# Patient Record
Sex: Male | Born: 1995 | Hispanic: No | Marital: Single | Smoking: Never smoker
Health system: Southern US, Community
[De-identification: ages and names within clinical notes are randomized; demographics above are authoritative.]

## PROBLEM LIST (undated history)

## (undated) DIAGNOSIS — F79 Unspecified intellectual disabilities: Secondary | ICD-10-CM

---
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
## 2015-01-29 ENCOUNTER — Emergency Department (HOSPITAL_COMMUNITY): Payer: Medicaid Other

## 2015-01-29 ENCOUNTER — Encounter (HOSPITAL_COMMUNITY): Payer: Self-pay | Admitting: *Deleted

## 2015-01-29 ENCOUNTER — Emergency Department (HOSPITAL_COMMUNITY)
Admission: EM | Admit: 2015-01-29 | Discharge: 2015-01-30 | Disposition: A | Discharge status: Home / Self Care | Payer: Medicaid Other | Attending: Emergency Medicine | Admitting: Emergency Medicine

## 2015-01-29 ENCOUNTER — Encounter (HOSPITAL_COMMUNITY): Payer: Self-pay | Admitting: Emergency Medicine

## 2015-01-29 ENCOUNTER — Emergency Department (INDEPENDENT_AMBULATORY_CARE_PROVIDER_SITE_OTHER)
Admission: EM | Admit: 2015-01-29 | Discharge: 2015-01-29 | Disposition: A | Discharge status: Nursing Home (Includes ICF) | Payer: Self-pay | Source: Home / Self Care | Attending: Emergency Medicine | Admitting: Emergency Medicine

## 2015-01-29 DIAGNOSIS — R441 Visual hallucinations: Secondary | ICD-10-CM

## 2015-01-29 DIAGNOSIS — R440 Auditory hallucinations: Secondary | ICD-10-CM

## 2015-01-29 DIAGNOSIS — R443 Hallucinations, unspecified: Secondary | ICD-10-CM | POA: Insufficient documentation

## 2015-01-29 DIAGNOSIS — Z8659 Personal history of other mental and behavioral disorders: Secondary | ICD-10-CM | POA: Insufficient documentation

## 2015-01-29 HISTORY — DX: Unspecified intellectual disabilities: F79

## 2015-01-29 LAB — CBC WITH DIFFERENTIAL/PLATELET
Basophils Absolute: 0 10*3/uL (ref 0.0–0.1)
Basophils Relative: 1 % (ref 0–1)
EOS ABS: 0.1 10*3/uL (ref 0.0–0.7)
EOS PCT: 1 % (ref 0–5)
HEMATOCRIT: 45 % (ref 39.0–52.0)
HEMOGLOBIN: 14.8 g/dL (ref 13.0–17.0)
LYMPHS ABS: 1 10*3/uL (ref 0.7–4.0)
Lymphocytes Relative: 17 % (ref 12–46)
MCH: 29.8 pg (ref 26.0–34.0)
MCHC: 32.9 g/dL (ref 30.0–36.0)
MCV: 90.7 fL (ref 78.0–100.0)
MONO ABS: 0.4 10*3/uL (ref 0.1–1.0)
MONOS PCT: 8 % (ref 3–12)
Neutro Abs: 4.1 10*3/uL (ref 1.7–7.7)
Neutrophils Relative %: 73 % (ref 43–77)
PLATELETS: 237 10*3/uL (ref 150–400)
RBC: 4.96 MIL/uL (ref 4.22–5.81)
RDW: 13.2 % (ref 11.5–15.5)
WBC: 5.6 10*3/uL (ref 4.0–10.5)

## 2015-01-29 LAB — COMPREHENSIVE METABOLIC PANEL
ALBUMIN: 4.7 g/dL (ref 3.5–5.2)
ALT: 22 U/L (ref 0–53)
AST: 32 U/L (ref 0–37)
Alkaline Phosphatase: 48 U/L (ref 39–117)
Anion gap: 9 (ref 5–15)
BUN: 10 mg/dL (ref 6–23)
CALCIUM: 9.2 mg/dL (ref 8.4–10.5)
CO2: 28 mmol/L (ref 19–32)
CREATININE: 1.04 mg/dL (ref 0.50–1.35)
Chloride: 104 mmol/L (ref 96–112)
GFR calc Af Amer: >90 mL/min (ref >90)
GFR calc non Af Amer: >90 mL/min (ref >90)
Glucose, Bld: 70 mg/dL (ref 70–99)
Potassium: 3.6 mmol/L (ref 3.5–5.1)
SODIUM: 141 mmol/L (ref 135–145)
TOTAL PROTEIN: 7.3 g/dL (ref 6.0–8.3)
Total Bilirubin: 0.9 mg/dL (ref 0.3–1.2)

## 2015-01-29 LAB — RAPID HIV SCREEN (HIV 1/2 AB+AG)
HIV 1/2 ANTIBODIES: NONREACTIVE
HIV-1 P24 Antigen - HIV24: NONREACTIVE

## 2015-01-29 LAB — SALICYLATE LEVEL

## 2015-01-29 LAB — ACETAMINOPHEN LEVEL: Acetaminophen (Tylenol), Serum: <10 ug/mL — ABNORMAL LOW (ref 10–30)

## 2015-01-29 LAB — I-STAT CG4 LACTIC ACID, ED: LACTIC ACID, VENOUS: 1.27 mmol/L (ref 0.5–2.0)

## 2015-01-29 LAB — ETHANOL: Alcohol, Ethyl (B): <5 mg/dL (ref 0–9)

## 2015-01-29 MED ORDER — SODIUM CHLORIDE 0.9 % IV BOLUS (SEPSIS)
1000.0000 mL | Freq: Once | INTRAVENOUS | Status: AC
  Administered 2015-01-29: 1000 mL via INTRAVENOUS

## 2015-01-29 NOTE — ED Notes (Signed)
MD at bedside. 

## 2015-01-29 NOTE — BH Assessment (Signed)
Tele Assessment Note   Andrew Travis is an 19 y.o. male.  -Clinician talked with Dr. Silverio Lay about need for TTS.  Patient came in with complaint of back pain.  He also said that he is hearing voices and seeing people that are not there.  Head CT did not indicate anything unusual.  Patient speaks Spanish mainly.  He is very quiet and must be encouraged to speak up.  Patient does not elaborate on questions asked and will look back to parents about some answers.  Patient lives with parents and younger brother, who are also in the room.  Patient said that he has been seeing grey people and voices are telling him to do bad things.  Patient says he does not want to do the bad things however.  Pt said to Dr. Silverio Lay that voices tell him to kill himself.  Patient also says that these visual hallucinations are touching him.    Pt denies any HI or SI.  No previous suicide attempts.  Patient has no previous inpatient or outpatient care.  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  He says he graduated from high school.  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    When asked if he would consider staying at the mental health hospital for a few days he says no.  Parents are asked this same question and they said they would like to have him back home.    -Clinician discussed patient care with Hulan Fess, NP who said that she would like to see what a UDS would show and a thyroid panel.  Otherwise if parents can provide a safe environment and follow up on outpatient referrals, he should be able to go home.  Dr. Silverio Lay said that the thyroid panel would take a day to come back and he also doubts there would be anything to show up on a UDS.  He asked about having psychiatry see him in the AM on 03/19.  We talked about whether this would be advantageous if parents and patient were not open to inpatient care.  Dr. Silverio Lay is going to see patient and family again.  Axis I: Psychotic Disorder  NOS Axis II: Deferred Axis III:  Past Medical History  Diagnosis Date  . Mental retardation    Axis IV: other psychosocial or environmental problems Axis V: 41-50 serious symptoms  Past Medical History:  Past Medical History  Diagnosis Date  . Mental retardation     History reviewed. No pertinent past surgical history.  Family History: No family history on file.  Social History:  reports that he has never smoked. He does not have any smokeless tobacco history on file. He reports that he does not drink alcohol or use illicit drugs.  Additional Social History:  Alcohol / Drug Use Pain Medications: None Prescriptions: NOne Over the Counter: N/A History of alcohol / drug use?: No history of alcohol / drug abuse (Pt denies)  CIWA: CIWA-Ar BP: 101/62 mmHg Pulse Rate: (!) 40 COWS:    PATIENT STRENGTHS: (choose at least two) Supportive family/friends Work skills  Allergies: No Known Allergies  Home Medications:  (Not in a hospital admission)  OB/GYN Status:  No LMP for male patient.  General Assessment Data Location of Assessment: [facility] ED Is this a Tele or Face-to-Face Assessment?: Tele Assessment Is this an Initial Assessment or a Re-assessment for this encounter?: Initial Assessment Living Arrangements: Parent (Lives with parents and brother) Can pt return to current living arrangement?:  Yes Admission Status: Voluntary Is patient capable of signing voluntary admission?: Yes Transfer from: Acute Hospital Referral Source: Self/Family/Friend     [facility] Crisis Care Plan Living Arrangements: Parent (Lives with parents and brother) Name of Psychiatrist: None Name of Therapist: None  Education Status Highest grade of school patient has completed: HS graduate  Risk to self with the past 6 months Suicidal Ideation: No Suicidal Intent: No Is patient at risk for suicide?: No Suicidal Plan?: No Access to Means: No What has been your use of drugs/alcohol within the last  12 months?: Pt denies Previous Attempts/Gestures: No How many times?: 0 Other Self Harm Risks: None Triggers for Past Attempts: None known Intentional Self Injurious Behavior: None Family Suicide History: No Recent stressful life event(s): Other (Comment) (Pt cannot identify any.) Persecutory voices/beliefs?: Yes Depression: Yes Depression Symptoms: Despondent, Insomnia, Loss of interest in usual pleasures, Feeling worthless/self pity Substance abuse history and/or treatment for substance abuse?: No Suicide prevention information given to non-admitted patients: Not applicable  Risk to Others within the past 6 months Homicidal Ideation: No Thoughts of Harm to Others: No Current Homicidal Intent: No Current Homicidal Plan: No Access to Homicidal Means: No Identified Victim: No one History of harm to others?: No Assessment of Violence: None Noted Violent Behavior Description: None reported Does patient have access to weapons?: No Criminal Charges Pending?: No Does patient have a court date: No  Psychosis Hallucinations: Auditory, Visual, Tactile Wallace Cullens people who tell him bad things and touch him.) Delusions: None noted  Mental Status Report Appearance/Hygiene: Disheveled, In scrubs Eye Contact: Good Motor Activity: Freedom of movement, Unremarkable Speech: Logical/coherent Level of Consciousness: Alert Mood: Depressed, Helpless, Despair, Sad Affect: Depressed, Blunted Anxiety Level: None Thought Processes: Coherent, Relevant Judgement: Unimpaired Orientation: Person, Place, Time, Situation Obsessive Compulsive Thoughts/Behaviors: None  Cognitive Functioning Concentration: Decreased Memory: Recent Impaired, Remote Impaired IQ: Average Insight: Poor Impulse Control: Fair Appetite: Good Weight Loss: 0 Weight Gain: 0 Sleep: Decreased Total Hours of Sleep:  (<4H last night) Vegetative Symptoms: None  ADLScreening [facility] Assessment Services) Patient's cognitive  ability adequate to safely complete daily activities?: Yes Patient able to express need for assistance with ADLs?: Yes Independently performs ADLs?: Yes (appropriate for developmental age)  Prior Inpatient Therapy Prior Inpatient Therapy: No Prior Therapy Dates: None Prior Therapy Facilty/Provider(s): None Reason for Treatment: None  Prior Outpatient Therapy Prior Outpatient Therapy: No Prior Therapy Dates: NOne Prior Therapy Facilty/Provider(s): None Reason for Treatment: NOne  ADL Screening (condition at time of admission) Patient's cognitive ability adequate to safely complete daily activities?: Yes Is the patient deaf or have difficulty hearing?: No Does the patient have difficulty seeing, even when wearing glasses/contacts?: No (Pt has glasses.) Does the patient have difficulty concentrating, remembering, or making decisions?: Yes Patient able to express need for assistance with ADLs?: Yes Does the patient have difficulty dressing or bathing?: No Independently performs ADLs?: Yes (appropriate for developmental age) Does the patient have difficulty walking or climbing stairs?: No Weakness of Legs: None Weakness of Arms/Hands: None       Abuse/Neglect Assessment (Assessment to be complete while patient is alone) Physical Abuse: Denies Verbal Abuse: Denies Sexual Abuse: Denies Exploitation of patient/patient's resources: Denies Self-Neglect: Denies     Merchant navy officer (For Healthcare) Does patient have an advance directive?: No Would patient like information on creating an advanced directive?: No - patient declined information    Additional Information 1:1 In Past 12 Months?: No CIRT Risk: No Elopement Risk: No Does patient have medical  clearance?: Yes     Disposition:  Disposition Initial Assessment Completed for this Encounter: Yes Disposition of Patient: Other dispositions Other disposition(s): Other (Comment) (To be reviewed by NP)  Beatriz StallionHarvey, Alban Marucci  Ray 01/29/2015 11:18 PM

## 2015-01-29 NOTE — ED Provider Notes (Signed)
CSN: [id number]     Arrival date & time 01/29/15  1425 History   First MD Initiated Contact with Patient 01/29/15 1631     Chief Complaint  Patient presents with  . Fever   (Consider location/radiation/quality/duration/timing/severity/associated sxs/prior Treatment) HPI         19 year old male with a history of some unspecified cognitive deficits is brought in by his mom for evaluation of having fever and shaking chills last night as well as hallucinations. He says that last night he saw some very skinny people and he heard them saying his name, and they were telling him to do bad things but he would not do them because there was another voice telling him not to do those things. He also complains of some pain in the back of his neck, it is not that bad. He has never been seen for this problem before and has no documented history of hallucinations.  Past Medical History  Diagnosis Date  . Mental retardation    History reviewed. No pertinent past surgical history. No family history on file. History  Substance Use Topics  . Smoking status: Never Smoker   . Smokeless tobacco: Not on file  . Alcohol Use: No    Review of Systems  Constitutional: Positive for chills.  Musculoskeletal: Positive for neck pain.  Psychiatric/Behavioral: Positive for hallucinations.  All other systems reviewed and are negative.   Allergies  Review of patient's allergies indicates no known allergies.  Home Medications   Prior to Admission medications   Not on File   BP 153/81 mmHg  Pulse 71  Temp(Src) 99.5 F (37.5 C) (Oral)  Resp 14  SpO2 97% Physical Exam  Constitutional: He is oriented to person, place, and time. He appears well-developed and well-nourished. No distress.  HENT:  Head: Normocephalic.  Pulmonary/Chest: Effort normal. No respiratory distress.  Neurological: He is alert and oriented to person, place, and time. Coordination normal.  Skin: Skin is warm and dry. No rash noted. He is  not diaphoretic.  Psychiatric: He has a normal mood and affect. His speech is normal. Judgment normal. His mood appears not anxious. His affect is not angry. He is slowed. He is not aggressive and not hyperactive. Thought content is delusional. Cognition and memory are impaired. He does not exhibit a depressed mood.  Nursing note and vitals reviewed.   ED Course  Procedures (including critical care time) Labs Review Labs Reviewed - No data to display  Imaging Review No results found.   MDM   1. Auditory hallucinations   2. Visual hallucination    19 year old male with new onset of command audiovisual hallucinations. Transferred to the emergency department for evaluation   Graylon GoodZachary H Dayvin Aber, PA-C 01/29/15 1657

## 2015-01-29 NOTE — ED Notes (Signed)
Mom brings pt in for fevers onset yest night Also reports "hallucinating" talking to himself Also reports HA Alert, no signs of acute distress.

## 2015-01-29 NOTE — ED Provider Notes (Addendum)
CSN: [id number]     Arrival date & time 01/29/15  1731 History   First MD Initiated Contact with Patient 01/29/15 2033     Chief Complaint  Patient presents with  . Psychiatric Evaluation     (Consider location/radiation/quality/duration/timing/severity/associated sxs/prior Treatment) The history is provided by the patient.  Andrew Travis is a 19 y.o. 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 Patient states that he has been having visual and auditory hallucinations for the last year or so. States that he sees skinny people walking around. These people also talks to him and tells him that he is too skinny and that he should kill himself. He denies any plans to kill himself. No history of psychiatric disorders. Patient was so afraid of the auditory hallucination yesterday that he wasn't able to sleep and told his family. His family got concerned and brought him to the ER for evaluation.   Past Medical History  Diagnosis Date  . Mental retardation    History reviewed. No pertinent past surgical history. No family history on file. History  Substance Use Topics  . Smoking status: Never Smoker   . Smokeless tobacco: Not on file  . Alcohol Use: No    Review of Systems  Psychiatric/Behavioral: Positive for hallucinations.  All other systems reviewed and are negative.     Allergies  Review of patient's allergies indicates no known allergies.  Home Medications   Prior to Admission medications   Not on File   BP 101/62 mmHg  Pulse 40  Temp(Src) 97.9 F (36.6 C) (Oral)  Resp 18  SpO2 97% Physical Exam  Constitutional: He is oriented to person, place, and time. He appears well-developed.  HENT:  Head: Normocephalic.  Mouth/Throat: Oropharynx is clear and moist.  Eyes: Conjunctivae are normal. Pupils are equal, round, and reactive to light.  Neck: Normal range of motion.  Cardiovascular: Normal rate, regular rhythm and normal  heart sounds.   Pulmonary/Chest: Effort normal and breath sounds normal. No respiratory distress. He has no wheezes. He has no rales.  Abdominal: Soft. Bowel sounds are normal. He exhibits no distension. There is no tenderness. There is no rebound.  Musculoskeletal: Normal range of motion. He exhibits no edema.  Neurological: He is alert and oriented to person, place, and time. No cranial nerve deficit. Coordination normal.  Skin: Skin is warm and dry.  Psychiatric:  Poor judgement   Nursing note and vitals reviewed.   ED Course  Procedures (including critical care time) Labs Review Labs Reviewed  ACETAMINOPHEN LEVEL - Abnormal; Notable for the following:    Acetaminophen (Tylenol), Serum <10.0 (*)    All other components within normal limits  URINE CULTURE  CBC WITH DIFFERENTIAL/PLATELET  COMPREHENSIVE METABOLIC PANEL  RAPID HIV SCREEN (HIV 1/2 AB+AG)  ETHANOL  SALICYLATE LEVEL  URINALYSIS, ROUTINE W REFLEX MICROSCOPIC  URINE RAPID DRUG SCREEN (HOSP PERFORMED)  I-STAT CG4 LACTIC ACID, ED    Imaging Review Dg Chest 2 View  01/29/2015   CLINICAL DATA:  Fever today.  EXAM: CHEST  2 VIEW  COMPARISON:  None.  FINDINGS: Normal heart, mediastinum and hila. The lungs are clear. No pleural effusion or pneumothorax. Bony thorax is unremarkable.  IMPRESSION: Normal chest radiographs.   Electronically Signed   By: Amie Portland M.D.   On: 01/29/2015 21:32   Ct Head Wo Contrast  01/29/2015   CLINICAL DATA:  Hearing voices and seeing people not Visible to other people. His mom Reports no street  drug or alcohol use but he has had similar episodes in the past.  EXAM: CT HEAD WITHOUT CONTRAST  TECHNIQUE: Contiguous axial images were obtained from the base of the skull through the vertex without intravenous contrast.  COMPARISON:  None.  FINDINGS: Ventricles normal in size and configuration. There are no parenchymal masses or mass effect. There are no areas of abnormal parenchymal attenuation. No  evidence of an infarct.  There are no extra-axial masses or abnormal fluid collections.  There is no intracranial hemorrhage.  Visualized sinuses and mastoid air cells are clear. No skull lesion.  IMPRESSION: Normal unenhanced CT scan of the brain.   Electronically Signed   By: Amie Portlandavid  Ormond M.D.   On: 01/29/2015 21:42     EKG Interpretation None      MDM   Final diagnoses:  None    Andrew Travis is a 19 y.o. male here with new onset visual but mostly auditory hallucinations. Will get CT head to r/o mass. Will get HIV and psych clearance labs.   10:45 PM CT head unremarkable. Labs unremarkable. Medically cleared. TTS to see patient.    12:30 AM TTS saw patient. Recommend outpatient follow up as he denies suicidal ideation to them and has no plan. Offered psych admission but parents are concerned that he will be by himself since they can't stay with him. Offered overnight observation with psych consult in AM but they want to go home. Patient lives with parents. Will give list of resources.     Andrew Canalavid H Noble Cicalese, MD 01/29/15 2246  Andrew Canalavid H Rilee Wendling, MD 01/30/15 [phone]

## 2015-01-29 NOTE — ED Notes (Signed)
The pt [facility] with  Hearing voices and seeing people not  Visible to other people.  His mom  Reports no street drug or alcohol use but he has had similar episodes in the past.  Pt calm

## 2015-01-29 NOTE — ED Notes (Signed)
TTS in process 

## 2015-01-29 NOTE — ED Notes (Signed)
Patient not in room, pt transported to X-ray.

## 2015-01-30 LAB — URINALYSIS, ROUTINE W REFLEX MICROSCOPIC
Bilirubin Urine: NEGATIVE
GLUCOSE, UA: NEGATIVE mg/dL
Hgb urine dipstick: NEGATIVE
Ketones, ur: NEGATIVE mg/dL
Leukocytes, UA: NEGATIVE
NITRITE: NEGATIVE
PH: 6 (ref 5.0–8.0)
PROTEIN: NEGATIVE mg/dL
Specific Gravity, Urine: 1.011 (ref 1.005–1.030)
Urobilinogen, UA: 0.2 mg/dL (ref 0.0–1.0)

## 2015-01-30 LAB — RAPID URINE DRUG SCREEN, HOSP PERFORMED
AMPHETAMINES: NOT DETECTED
Barbiturates: NOT DETECTED
Benzodiazepines: NOT DETECTED
Cocaine: NOT DETECTED
Opiates: NOT DETECTED
TETRAHYDROCANNABINOL: NOT DETECTED

## 2015-01-30 LAB — TSH: TSH: 3.909 u[IU]/mL (ref 0.350–4.500)

## 2015-01-30 NOTE — Discharge Instructions (Signed)
See a psychiatrist.   Return to ER if you have thoughts of harming yourself or others, hallucinations.    Emergency Department Resource Guide 1) Find a Doctor and Pay Out of Pocket Although you won't have to find out who is covered by your insurance plan, it is a good idea to ask around and get recommendations. You will then need to call the office and see if the doctor you have chosen will accept you as a new patient and what types of options they offer for patients who are self-pay. Some doctors offer discounts or will set up payment plans for their patients who do not have insurance, but you will need to ask so you aren't surprised when you get to your appointment.  2) Contact Your Local Health Department Not all health departments have doctors that can see patients for sick visits, but many do, so it is worth a call to see if yours does. If you don't know where your local health department is, you can check in your phone book. The CDC also has a tool to help you locate your state's health department, and many state websites also have listings of all of their local health departments.  3) Find a Walk-in Clinic If your illness is not likely to be very severe or complicated, you may want to try a walk in clinic. These are popping up all over the country in pharmacies, drugstores, and shopping centers. They're usually staffed by nurse practitioners or physician assistants that have been trained to treat common illnesses and complaints. They're usually fairly quick and inexpensive. However, if you have serious medical issues or chronic medical problems, these are probably not your best option.  No Primary Care Doctor: - Call Health Connect at  [phone] - they can help you locate a primary care doctor that  accepts your insurance, provides certain services, etc. - Physician Referral Service- [phone]  Chronic Pain Problems: Organization         Address  Phone   Notes  [facility]  [phone] Patients need to be referred by their primary care doctor.   Medication Assistance: Organization         Address  Phone   Notes  [facility] [address] [place], [place] [address] [phone] --Must be a resident of [facility] [place] -- Must have NO insurance coverage whatsoever (no Medicaid/ Medicare, etc.) -- The pt. MUST have a primary care doctor that directs their care regularly and follows them in the community   MedAssist  [phone]) [phone]   [employer]  [phone]    Agencies that provide inexpensive medical care: Organization         Address  Phone   Notes  [facility]  [phone]   [facility]    [phone]   [facility] [address] [place], [place] [address] [phone]   [facility] [address], [place] [phone]) [phone]   [facility]anned Parenthood    [phone]   [facility]    [phone]   [facility]  [address], [place] Phone:  [phone], Fax:  [phone]) [phone] Hours of Operation:  9 am - 6 pm, M-F.  Also accepts Medicaid/Medicare and self-pay.  [facility]  [address], [place] Phone: [phone]) [phone], Fax: [phone].  Hours of Operation:  8:30 am - 5:30 pm, M-F.  Also accepts Medicaid and self-pay.  [facility] [place] [address], Home Phone: [phone]   [place], [place], [place] [phone], Ext. [phone] Mondays & Thursdays: 7-9 AM.  First 15 patients are seen on a first come, first serve basis.    [place] Providers:  Organization         Address  Phone   Notes  [facility] [address], [place] [phone] Also accepts self-pay patients.  [facility] [address], [employer]  [phone]   [place], [address], [place] [phone]   [facility] [address] [place] [phone]   Lucianne Lei [address], [place]   [phone] Only accepts [place] Access [place] patients after they have their name applied to their card.   Self-Pay (no insurance) in [facility]:  Organization         Address  Phone   Notes  Sickle Cell Patients, [facility] [place] [phone]   [facility] [place] [phone]   [facility] [place]  [place], [place],  [phone]   [facility]/Dr. Osei-Bonsu  [address] [place] or [address], [place] [phone] Phone number for both [place] and [place] locations is the same.  [facility] [address], [place] [phone]   [facility] [address] [place] or [address] [phone]   [facility] [address] [place] [phone], phone; [phone], fax Sees patients 1st and 3rd Saturday of every month.  Must not qualify for public or private insurance (i.e. Medicaid, Medicare, [place] Health Choice, Veterans' Benefits)  Household income should be no more than 200% of the poverty level The clinic cannot treat you if you are pregnant or think you are pregnant  Sexually transmitted diseases are not treated at the clinic.    Dental Care: Organization         Address  Phone  Notes  [facility] [phone] Accepts children up to age 74 who are enrolled in [place] or [place]; pregnant women with a Medicaid card; and children who have applied for Medicaid or [place]  Health Choice, but were declined, whose parents can pay a reduced fee at time of service.  [facility] of [facility]  [address], [place] [phone] Accepts children up to age 16 who are enrolled in [place] or [place]; pregnant women with a Medicaid card; and children who have applied for Medicaid or [place] Health Choice, but were declined, whose parents can pay a reduced fee at time of service.  [facility] Access PROGRAM  [place] [phone] Patients are seen by appointment only. Walk-ins are not accepted. [place] will see patients 18 years of age and older. Monday - Tuesday (8am-5pm) Most Wednesdays (8:30-5pm) $30 per visit, cash only  [facility] Adult [employer] PROGRAM  [address], [facility] [phone] Patients  are seen by appointment only. Walk-ins are not accepted. [place] will see patients 46 years of age and older. One Wednesday Evening (Monthly: Volunteer Based).  $30 per visit, cash only  [place]  [phone] for adults; Children under age 68, call Graduate Pediatric Dentistry at [phone]. Children aged 19-14, please call [phone] to request a pediatric application.  Dental services are provided in all areas of dental care including fillings, crowns and bridges, complete and partial dentures, implants, gum treatment, root canals, and extractions. Preventive care is also provided. Treatment is provided to both adults and children. Patients are selected via a lottery and there is often a waiting list.   [facility] [address], [place]  [phone] [email or web address]   [facility] [address] [place], [place] [phone], Ext. [phone] Second and Fourth Thursday of each month, opens at 6:30 AM; Clinic ends at 9 AM.  Patients are seen on a first-come first-served basis, and a limited number are seen during  each clinic.   [facility]  [address] Hillard Danker [place], [place] [phone]   Eligibility Requirements You must have lived in [place], [place], or [place] counties for at least the last three months.   You cannot be eligible for state or federal sponsored [employer], including [employer], [place], or [employer].   You generally cannot be eligible for healthcare insurance through your employer.    How to apply: Eligibility screenings are held every Tuesday and Wednesday afternoon from 1:00 pm until 4:00 pm. You do not need an appointment for the interview!  [facility] [address] [place], [place]   [place]  [place] Department  [place]  [phone]    Behavioral Health Resources in the Community: Intensive Outpatient Programs Organization         Address  Phone  Notes  [place] Hurricane. [address] [place], [place] [phone]   [facility]patient [address] [place], [place]   ADS: Alcohol & Drug Svcs [address] [place], [place]   [place] [address],  [place], [place] or [phone]   Substance Abuse Resources Organization         Address  Phone  Notes  Alcohol and Drug Services  [phone]   [place]  [phone]   [facility] [place]   Chinita Pester  [phone]   Residential & Outpatient Substance Abuse Program  [phone]   Psychological Services Organization         Address  Phone  Notes  [facility]  [place]  [phone]   [place] [address] [place] or [phone]    Mobile Crisis Teams Organization         Address  Phone  Notes  [facility]rapeutic Alternatives, Mobile  Crisis Care Unit  [phone]   [facility]otherapeutic Services  [address] [place], [place]   [place] Levels [address], [place] [phone]    Self-Help/Support Groups Organization         Address  Phone             Notes  [place]. of [place] - variety of support groups  [place] Call  for more information  Narcotics Anonymous (NA), [facility] [address], [place]  2 meetings at this location   Residential Facilities manager         Address  Phone  Notes  [facility] [place],    Lead  [phone]   [facility]  [address], [place] [phone], [place], [place]   [place], [place] [phone] Admissions: 8am-3pm M-F  Incentives Substance [place] [address],    [place], [place] [phone]   [facility] [address] [place], [place], [place]   [facility] [address]  [place], [place]   [facility] Programs - Intensive Outpatient [address]een [address], [place], [place]   [facility] [place].,  [place], [place] [phone] or [phone]   [facility] (RTS) [address] [place], [place] Accepts Medicaid  [facility] [address]  [place] [phone] Substance Abuse/Addiction Treatment   [facility] Organization         Address  Phone  Notes  [facility]  [phone]   Domenic Schwab, PhD [address] [place], [place]   [phone] or [phone]   [place], [place] [phone]   [facility] [address], [place], [place] [phone] Insurance/Medicaid/sponsorship through [facility] and Families [address]  [place]                                    [place], [place] [phone] [place] [address] [place] [address] [employer], [place] [phone]    Dr. Adele Schilder  [phone]   [facility] Dept. 1) [address] [place] 2) [place] 3)  [place] [address], [place] [phone]  [phone]   [place] [phone] or [phone] (After Hours)

## 2015-01-31 LAB — URINE CULTURE

## 2017-11-28 ENCOUNTER — Emergency Department (HOSPITAL_COMMUNITY)
Admission: EM | Admit: 2017-11-28 | Discharge: 2017-11-29 | Disposition: A | Discharge status: Home / Self Care | Payer: Self-pay | Attending: Emergency Medicine | Admitting: Emergency Medicine

## 2017-11-28 DIAGNOSIS — F22 Delusional disorders: Secondary | ICD-10-CM | POA: Insufficient documentation

## 2017-11-28 DIAGNOSIS — F341 Dysthymic disorder: Secondary | ICD-10-CM | POA: Insufficient documentation

## 2017-11-28 NOTE — ED Notes (Signed)
Bed: WA28 Expected date:  Expected time:  Means of arrival:  Comments: [facility] 

## 2017-11-28 NOTE — ED Triage Notes (Signed)
[facility]  D/t no physician on duty tonight pt has been acting very strangely. Pt while in [place] with pt was observed by family carrying multiple knifes around his waist . Father denies self injurious behavior aggression or AV hallucination  But pt did at one time in the recent past state he would harm himself if they didn't take him to [place]. Pt remains mute during this interview and father states this is his norm. Father [facility] in recent past was Rx Olanzapine 2.5 mg in AM and 5 mg at HS  And fluxetine 40 mg in the AM but believes pt is cheeking medications and spitting them out later.

## 2017-11-29 ENCOUNTER — Other Ambulatory Visit: Payer: Self-pay

## 2017-11-29 ENCOUNTER — Emergency Department (HOSPITAL_COMMUNITY): Payer: Self-pay

## 2017-11-29 LAB — COMPREHENSIVE METABOLIC PANEL
ALBUMIN: 4.2 g/dL (ref 3.5–5.0)
ALK PHOS: 64 U/L (ref 38–126)
ALT: 29 U/L (ref 17–63)
AST: 37 U/L (ref 15–41)
Anion gap: 8 (ref 5–15)
BILIRUBIN TOTAL: 0.2 mg/dL — AB (ref 0.3–1.2)
BUN: 16 mg/dL (ref 6–20)
CALCIUM: 9.1 mg/dL (ref 8.9–10.3)
CO2: 25 mmol/L (ref 22–32)
Chloride: 104 mmol/L (ref 101–111)
Creatinine, Ser: 1.05 mg/dL (ref 0.61–1.24)
GFR calc Af Amer: >60 mL/min (ref >60)
GLUCOSE: 118 mg/dL — AB (ref 65–99)
POTASSIUM: 3.7 mmol/L (ref 3.5–5.1)
Sodium: 137 mmol/L (ref 135–145)
TOTAL PROTEIN: 6.9 g/dL (ref 6.5–8.1)

## 2017-11-29 LAB — SALICYLATE LEVEL: Salicylate Lvl: <7 mg/dL (ref 2.8–30.0)

## 2017-11-29 LAB — CBC
HCT: 43.3 % (ref 39.0–52.0)
Hemoglobin: 14 g/dL (ref 13.0–17.0)
MCH: 29.4 pg (ref 26.0–34.0)
MCHC: 32.3 g/dL (ref 30.0–36.0)
MCV: 91 fL (ref 78.0–100.0)
Platelets: 258 10*3/uL (ref 150–400)
RBC: 4.76 MIL/uL (ref 4.22–5.81)
RDW: 13.2 % (ref 11.5–15.5)
WBC: 8.1 10*3/uL (ref 4.0–10.5)

## 2017-11-29 LAB — ETHANOL

## 2017-11-29 LAB — ACETAMINOPHEN LEVEL: Acetaminophen (Tylenol), Serum: <10 ug/mL — ABNORMAL LOW (ref 10–30)

## 2017-11-29 MED ORDER — FLUOXETINE HCL 20 MG PO CAPS: 40.0000 mg | ORAL_CAPSULE | Freq: Every day | ORAL | Status: DC

## 2017-11-29 NOTE — BH Assessment (Addendum)
Assessment Note  Andrew Travis is an 22 y.o. male who presents to the ED voluntarily accompanied by his father. Pt is partly catatonic at various times throughout the assessment and does not respond to direct questions. Pt is able to state his DOB accurately, however when pt was asked why he came to the ED pt stares blindly at this writer and does not respond. Pt's father states the pt has been acting "bizarre at home" for the past several weeks. Pt is reportedly followed by [facility] for medication management and the pt's father states he is unsure if the pt is actually taking the medication that he is prescribed. Writer asked the pt if he is currently suicidal and pt stated "what!" very loudly. Pt then began to say "what you want me to do" multiple times as the writer attempted to ask the pt if he is experiencing SI or HI. Pt was asked if he experiences AVH and pt did not respond. Pt states "I came here for a reason" but does not disclose additional information. Pt speaks in incomplete thoughts throughout the assessment or does not respond at all when asked direct question.    Disposition: Consulted with Donell Sievert, PA and pt is recommended for inpt treatment. [facility] at capacity for 500 hall beds. TTS to seek placement. EDP Palumbo, April, MD and pt's nurse Reita Cliche, RN have been advised of disposition.    Diagnosis: Brief Psychotic Disorder  Past Medical History:  Past Medical History:  Diagnosis Date  . Mental retardation     No past surgical history on file.  Family History: No family history on file.  Social History:  reports that  has never smoked. He does not have any smokeless tobacco history on file. He reports that he does not drink alcohol or use drugs.  Additional Social History:  Alcohol / Drug Use Pain Medications: See MAR Prescriptions: See MAR Over the Counter: See MAR History of alcohol / drug use?: No history of alcohol / drug abuse  CIWA: CIWA-Ar BP: 122/72 Pulse Rate:  92 COWS:    Allergies: No Known Allergies  Home Medications:  (Not in a hospital admission)  OB/GYN Status:  No LMP for male patient.  General Assessment Data Location of Assessment: [facility] TTS Assessment: In system Is this a Tele or Face-to-Face Assessment?: Face-to-Face Is this an Initial Assessment or a Re-assessment for this encounter?: Initial Assessment Marital status: Single Is patient pregnant?: No Pregnancy Status: No Living Arrangements: Parent Can pt return to current living arrangement?: Yes Admission Status: Voluntary Is patient capable of signing voluntary admission?: Yes Referral Source: Self/Family/Friend Insurance type: none     Crisis Care Plan Living Arrangements: Parent Name of Psychiatrist: Transport planner Name of Therapist: Transport planner  Education Status Is patient currently in school?: No Highest grade of school patient has completed: 12th  Risk to self with the past 6 months Suicidal Ideation: (UTA, when asked pt stated "what you want me to do") Has patient been a risk to self within the past 6 months prior to admission? : (UTA due to AMS) Suicidal Intent: (UTA due to AMS) Has patient had any suicidal intent within the past 6 months prior to admission? : (UTA due to AMS) Is patient at risk for suicide?: (UTA due to AMS) Suicidal Plan?: (UTA due to AMS) Has patient had any suicidal plan within the past 6 months prior to admission? : (UTA due to AMS) Access to Means: (UTA due to AMS) What has been your use  of drugs/alcohol within the last 12 months?: denies Previous Attempts/Gestures: (UTA due to AMS) Triggers for Past Attempts: (UTA due to AMS) Intentional Self Injurious Behavior: (UTA due to AMS) Family Suicide History: No Recent stressful life event(s): Other (Comment)(UTA due to AMS) Persecutory voices/beliefs?: (UTA due to AMS) Depression: (UTA due to AMS) Depression Symptoms: Insomnia, Isolating, Feeling angry/irritable(per collateral reports from  the pt's father) Substance abuse history and/or treatment for substance abuse?: (UTA due to AMS) Suicide prevention information given to non-admitted patients: Not applicable  Risk to Others within the past 6 months Homicidal Ideation: (UTA due to AMS) Does patient have any lifetime risk of violence toward others beyond the six months prior to admission? : (UTA due to AMS) Thoughts of Harm to Others: (UTA due to AMS) Current Homicidal Intent: (UTA due to AMS) Current Homicidal Plan: (UTA due to AMS) Access to Homicidal Means: (UTA due to AMS) History of harm to others?: (UTA due to AMS) Assessment of Violence: (UTA due to AMS) Does patient have access to weapons?: (UTA due to AMS) Criminal Charges Pending?: No(per collateral info from the pt's father) Does patient have a court date: No Is patient on probation?: No  Psychosis Hallucinations: (UTA due to AMS) Delusions: Unspecified  Mental Status Report Appearance/Hygiene: In scrubs, Bizarre Eye Contact: Good Motor Activity: Freedom of movement Speech: Elective mutism, Aggressive Level of Consciousness: Alert Mood: Irritable Affect: Constricted Anxiety Level: None Thought Processes: Thought Blocking Judgement: Impaired Orientation: Person Obsessive Compulsive Thoughts/Behaviors: None  Cognitive Functioning Concentration: Poor Memory: Unable to Assess IQ: Average Insight: Poor Impulse Control: Poor Appetite: Poor Sleep: Decreased Total Hours of Sleep: 6 Vegetative Symptoms: None  ADLScreening [facility] Assessment Services) Patient's cognitive ability adequate to safely complete daily activities?: Yes Patient able to express need for assistance with ADLs?: Yes Independently performs ADLs?: Yes (appropriate for developmental age)  Prior Inpatient Therapy Prior Inpatient Therapy: No  Prior Outpatient Therapy Prior Outpatient Therapy: Yes Prior Therapy Dates: current Prior Therapy Facilty/Provider(s): [facility] Reason for  Treatment: med management  Does patient have an ACCT team?: No Does patient have Intensive In-House Services?  : No Does patient have [facility] services? : Yes Does patient have P4CC services?: No  ADL Screening (condition at time of admission) Patient's cognitive ability adequate to safely complete daily activities?: Yes Is the patient deaf or have difficulty hearing?: No Does the patient have difficulty seeing, even when wearing glasses/contacts?: No Does the patient have difficulty concentrating, remembering, or making decisions?: Yes Patient able to express need for assistance with ADLs?: Yes Does the patient have difficulty dressing or bathing?: No Independently performs ADLs?: Yes (appropriate for developmental age) Does the patient have difficulty walking or climbing stairs?: No Weakness of Legs: None Weakness of Arms/Hands: None  Home Assistive Devices/Equipment Home Assistive Devices/Equipment: None    Abuse/Neglect Assessment (Assessment to be complete while patient is alone) Abuse/Neglect Assessment Can Be Completed: Unable to assess, patient is non-responsive or altered mental status     Advance Directives (For Healthcare) Does Patient Have a Medical Advance Directive?: No Would patient like information on creating a medical advance directive?: No - Patient declined    Additional Information 1:1 In Past 12 Months?: No CIRT Risk: Yes Elopement Risk: No Does patient have medical clearance?: Yes     Disposition: Consulted with Donell Sievert, PA and pt is recommended for inpt treatment. [facility] at capacity for 500 hall beds. TTS to seek placement. EDP Palumbo, April, MD and pt's nurse Reita Cliche, RN have been advised  of disposition.    Disposition Initial Assessment Completed for this Encounter: Yes Disposition of Patient: Inpatient treatment program Type of inpatient treatment program: Adult(per Donell SievertSpencer Simon, PA)  On Site Evaluation by:   Reviewed with Physician:     Karolee OhsAquicha R Izzabell Klasen 11/29/2017 2:25 AM

## 2017-11-29 NOTE — ED Notes (Signed)
Attempting to move pt to [facility] father object to locked unit and request that his son be discharged if he is not going to be see by a psychiatrist tonight . Dad was informed that he would be evaluated in the morning by a psychiatrist father stated if we aren't going to medicate him now then he would take his son home now and then to [facility] the morning. Dr Nicanor AlconPalumbo informed of father's position and TTS counselor and PA also contacted and IVC was recommended. Dr Nicanor AlconPalumbo informed.

## 2017-11-29 NOTE — ED Notes (Signed)
Per EDP if pt's father feels that he is able to keep his son safe and would like to be discharged so they could [facility] she would accommodate his request. Father states he would [facility] go [facility] where he already has a physician who is providing on going care for his son.

## 2017-11-29 NOTE — BH Assessment (Signed)
[facility] Assessment Progress Note  Consulted with Donell SievertSpencer Simon, PA and pt is recommended for inpt treatment. [facility] at capacity for 500 hall beds. TTS to seek placement. EDP Palumbo, April, MD and pt's nurse Reita ClicheBobby, RN have been advised of disposition.   Princess BruinsAquicha Thressa Shiffer, MSW, LCSW Therapeutic Triage Specialist  [phone]

## 2017-11-29 NOTE — ED Provider Notes (Addendum)
[facility]-EMERGENCY DEPT Provider Note   CSN: [id number] Arrival date & time: 11/28/17  2331     History   Chief Complaint Chief Complaint  Patient presents with  . Medical Clearance    HPI Andrew Travis is a 22 y.o. male.  The history is provided by a parent. History limited by: patient will not speak.  Mental Health Problem  Presenting symptoms: aggressive behavior and bizarre behavior   Presenting symptoms: no hallucinations, no homicidal ideas and no self-mutilation   Presenting symptoms comment:  Does not speak most of the time, but yells Patient accompanied by:  Parent Degree of incapacity (severity):  Severe Onset quality:  Gradual Timing:  Constant Progression:  Worsening Chronicity:  Chronic Context: not drug abuse   Context comment:  May be tongueing his medication Treatment compliance:  Unable to specify Relieved by:  Nothing Worsened by:  Nothing Ineffective treatments:  None tried Associated symptoms: no weight change   Risk factors: hx of mental illness   Risk factors: no hx of suicide attempts   No doctor at [facility] so was sent here for evaluation of bizarre behavior since December.    Past Medical History:  Diagnosis Date  . Mental retardation     There are no active problems to display for this patient.   No past surgical history on file.     Home Medications    Prior to Admission medications   Medication Sig Start Date End Date Taking? Authorizing Provider  FLUoxetine (PROZAC) 40 MG capsule Take 40 mg by mouth daily.   Yes [provider]  OLANZapine (ZYPREXA) 2.5 MG tablet Take 2.5-5 mg by mouth 2 (two) times daily. 2.5 MG in the morning and 5 MG in the evening   Yes [provider]    Family History No family history on file.  Social History Social History   Tobacco Use  . Smoking status: Never Smoker  Substance Use Topics  . Alcohol use: No  . Drug use: No     Allergies   Patient  has no known allergies.   Review of Systems Review of Systems  Unable to perform ROS: Patient nonverbal  Constitutional: Negative for fever.  Respiratory: Negative for cough.   Psychiatric/Behavioral: Positive for behavioral problems. Negative for hallucinations, homicidal ideas and self-injury.     Physical Exam Updated Vital Signs BP 122/72 (BP Location: Right Arm)   Pulse 92   Temp 99.2 F (37.3 C) (Oral)   Resp 20   SpO2 96%   Physical Exam  Constitutional: He appears well-developed. No distress.  HENT:  Head: Normocephalic and atraumatic.  Mouth/Throat: No oropharyngeal exudate.  Eyes: Conjunctivae and EOM are normal. Pupils are equal, round, and reactive to light.  Neck: Normal range of motion. Neck supple.  Cardiovascular: Normal rate, regular rhythm, normal heart sounds and intact distal pulses.  Pulmonary/Chest: Effort normal and breath sounds normal. No stridor. He has no wheezes. He has no rales.  Abdominal: Soft. Bowel sounds are normal. He exhibits no mass. There is no tenderness. There is no rebound and no guarding.  Musculoskeletal: Normal range of motion. He exhibits no tenderness.  Neurological: He is alert. He displays normal reflexes.  Skin: Skin is warm and dry. Capillary refill takes less than 2 seconds.  Psychiatric:  unable  Nursing note and vitals reviewed.    ED Treatments / Results   Vitals:   11/28/17 2357  BP: 122/72  Pulse: 92  Resp: 20  Temp:  99.2 F (37.3 C)  SpO2: 96%    Labs (all labs ordered are listed, but only abnormal results are displayed)  Results for orders placed or performed during the hospital encounter of 11/28/17  Comprehensive metabolic panel  Result Value Ref Range   Sodium 137 135 - 145 mmol/L   Potassium 3.7 3.5 - 5.1 mmol/L   Chloride 104 101 - 111 mmol/L   CO2 25 22 - 32 mmol/L   Glucose, Bld 118 (H) 65 - 99 mg/dL   BUN 16 6 - 20 mg/dL   Creatinine, Ser 1.61 0.61 - 1.24 mg/dL   Calcium 9.1 8.9 - 09.6  mg/dL   Total Protein 6.9 6.5 - 8.1 g/dL   Albumin 4.2 3.5 - 5.0 g/dL   AST 37 15 - 41 U/L   ALT 29 17 - 63 U/L   Alkaline Phosphatase 64 38 - 126 U/L   Total Bilirubin 0.2 (L) 0.3 - 1.2 mg/dL   GFR calc non Af Amer >60 >60 mL/min   GFR calc Af Amer >60 >60 mL/min   Anion gap 8 5 - 15  Ethanol  Result Value Ref Range   Alcohol, Ethyl (B) <10 <10 mg/dL  Salicylate level  Result Value Ref Range   Salicylate Lvl <7.0 2.8 - 30.0 mg/dL  Acetaminophen level  Result Value Ref Range   Acetaminophen (Tylenol), Serum <10 (L) 10 - 30 ug/mL  cbc  Result Value Ref Range   WBC 8.1 4.0 - 10.5 K/uL   RBC 4.76 4.22 - 5.81 MIL/uL   Hemoglobin 14.0 13.0 - 17.0 g/dL   HCT 04.5 40.9 - 81.1 %   MCV 91.0 78.0 - 100.0 fL   MCH 29.4 26.0 - 34.0 pg   MCHC 32.3 30.0 - 36.0 g/dL   RDW 91.4 78.2 - 95.6 %   Platelets 258 150 - 400 K/uL   Ct Head Wo Contrast  Result Date: 11/29/2017 CLINICAL DATA:  Abnormal behavior.  No reported injury. EXAM: CT HEAD WITHOUT CONTRAST TECHNIQUE: Contiguous axial images were obtained from the base of the skull through the vertex without intravenous contrast. COMPARISON:  None. FINDINGS: Brain: No evidence of parenchymal hemorrhage or extra-axial fluid collection. No mass lesion, mass effect, or midline shift. No CT evidence of acute infarction. Cerebral volume is age appropriate. No ventriculomegaly. Vascular: No acute abnormality. Skull: No evidence of calvarial fracture. Sinuses/Orbits: The visualized paranasal sinuses are essentially clear. Other:  The mastoid air cells are unopacified. IMPRESSION: Negative head CT.  No evidence of acute intracranial abnormality. Electronically Signed   By: Delbert Phenix M.D.   On: 11/29/2017 01:08     Radiology Ct Head Wo Contrast  Result Date: 11/29/2017 CLINICAL DATA:  Abnormal behavior.  No reported injury. EXAM: CT HEAD WITHOUT CONTRAST TECHNIQUE: Contiguous axial images were obtained from the base of the skull through the vertex  without intravenous contrast. COMPARISON:  None. FINDINGS: Brain: No evidence of parenchymal hemorrhage or extra-axial fluid collection. No mass lesion, mass effect, or midline shift. No CT evidence of acute infarction. Cerebral volume is age appropriate. No ventriculomegaly. Vascular: No acute abnormality. Skull: No evidence of calvarial fracture. Sinuses/Orbits: The visualized paranasal sinuses are essentially clear. Other:  The mastoid air cells are unopacified. IMPRESSION: Negative head CT.  No evidence of acute intracranial abnormality. Electronically Signed   By: Delbert Phenix M.D.   On: 11/29/2017 01:08    Procedures Procedures (including critical care time)    Final Clinical Impressions(s) / ED Diagnoses  Final diagnoses:  Dysthymia   Medically cleared for TTS by me.  Dispo per tts.  Patient's home meds ordered.    Dad wants to take patient home.  He brought patient in to see a psychiatrist (specifically a psychiatrist) for medication recommendations only.  He does not want patient to be boarding and admitted.  Patient is not aggressive in the ED. Father states the patient's dearth of speech is long standing since the patient was much younger and in school.  I do not feel the patient meets criteria for IVC.  Father is willing to accept responsibility for the patient and wants to take patient home.  He has been discharged to home and is welcome to return at any time   [facility] Briannia Laba, MD 11/29/17 Shirlee [id number]    Drayton Tieu, MD 11/29/17 [id number]    Cy BlamerPalumbo, Belford Pascucci, MD 11/29/17 [id number]

## 2019-04-12 IMAGING — CR DG CHEST 2V
2 series · 2 of 2 positions shown · non-contrast
Comparison: None.

CLINICAL DATA: 21-year-old male with recent travel.  Seizure.

EXAM:
CHEST  2 VIEW

[w chest pa]
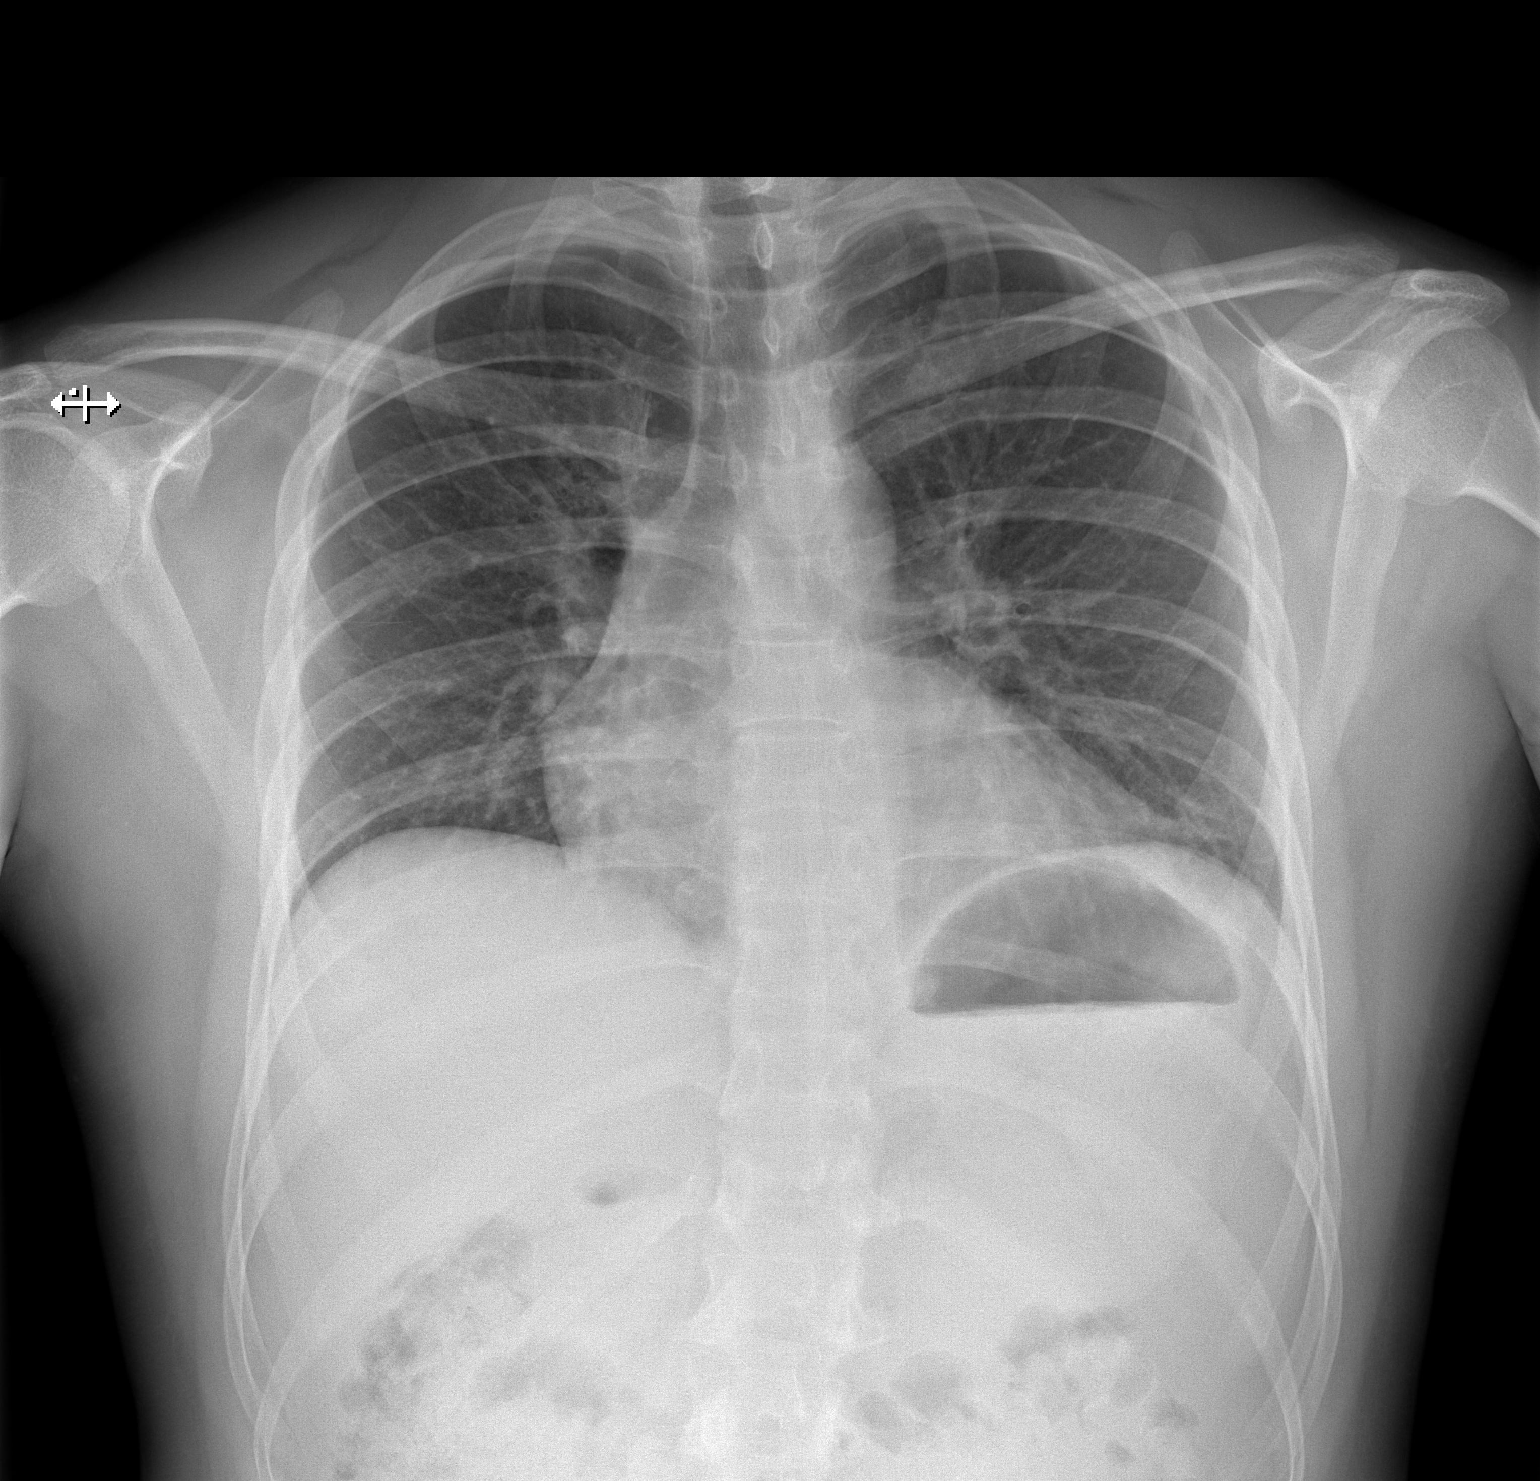

[w chest lat]
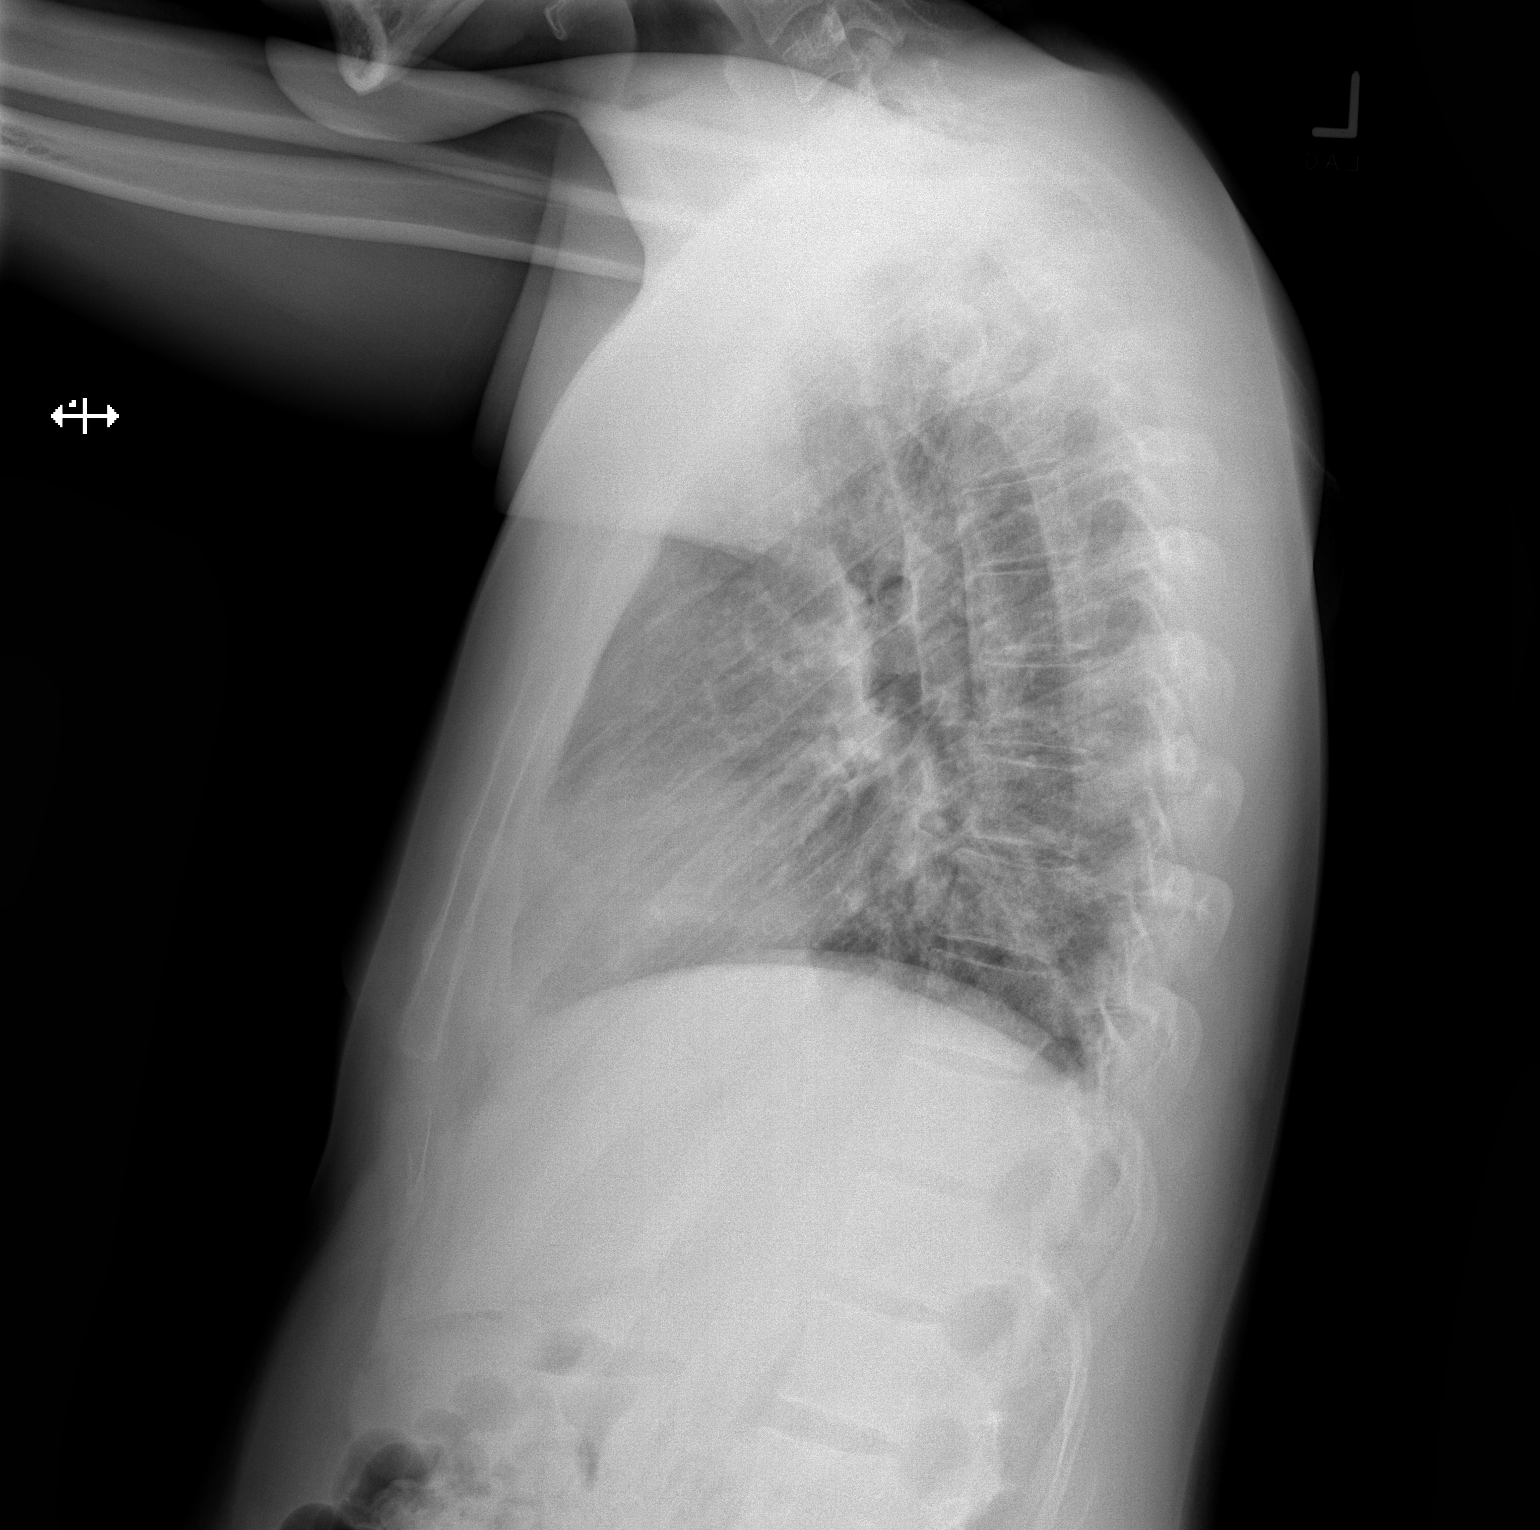

[2 of 2 positions shown; findings below may reference images not displayed]

FINDINGS: Shallow inspiration with minimal bibasilar atelectasis. No focal
consolidation, pleural effusion, or pneumothorax. The cardiac
silhouette is within normal limits. No acute osseous pathology.
IMPRESSION: No active cardiopulmonary disease.

## 2019-04-12 IMAGING — CT CT HEAD W/O CM
3 series · 16 of 47 positions shown, 19 images · non-contrast
Comparison: None.

CLINICAL DATA: Abnormal behavior.  No reported injury.

EXAM:
CT HEAD WITHOUT CONTRAST
TECHNIQUE: Contiguous axial images were obtained from the base of the skull
through the vertex without intravenous contrast.

[Series 2: head wo · axial · 0.47mm/px · z∈[+1481,+1611]mm · 10 of 32 slices shown, 13 images]
[im 3/32  brain]
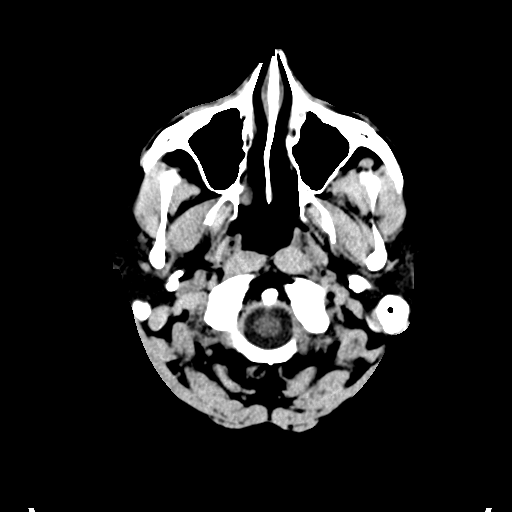
[im 3/32  bone]
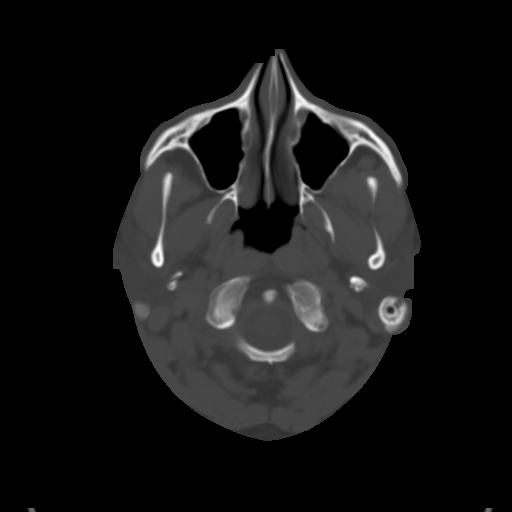
[im 6/32  brain]
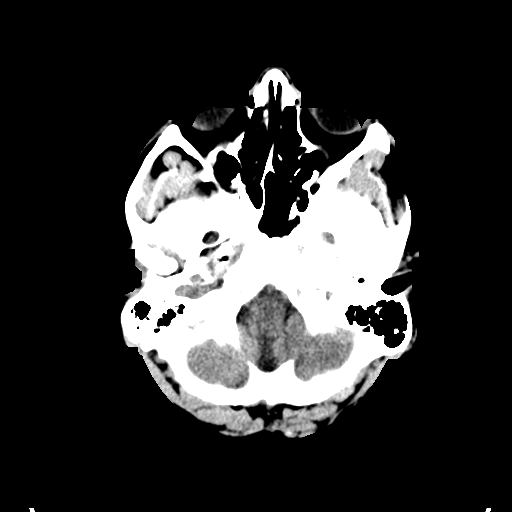
[im 9/32  brain]
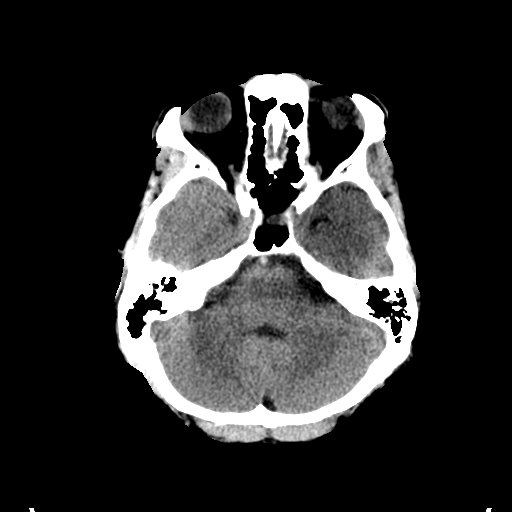
[im 11/32  brain]
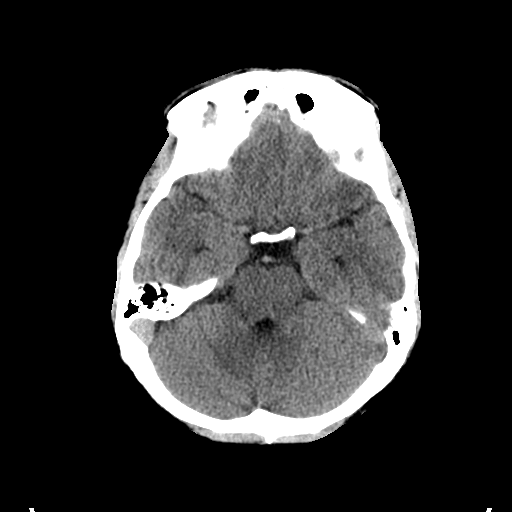
[im 14/32  brain]
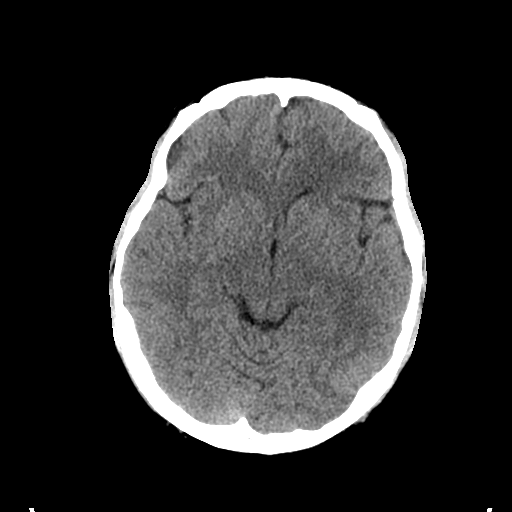
[im 14/32  bone]
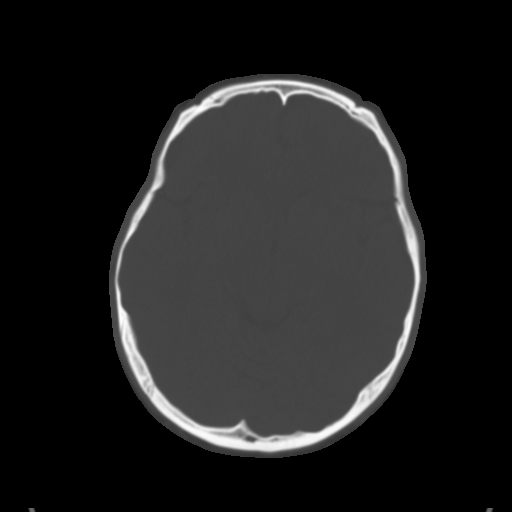
[im 18/32  brain]
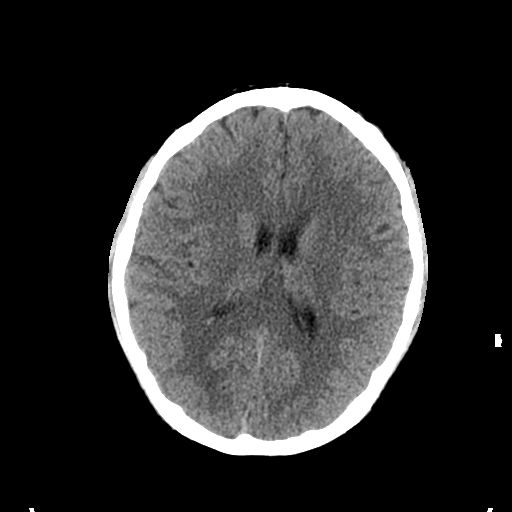
[im 21/32  brain]
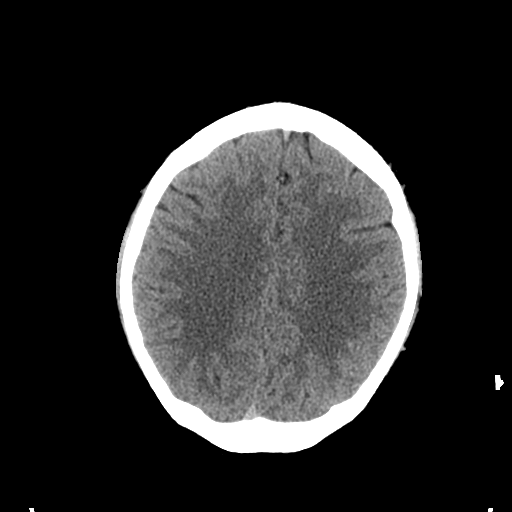
[im 24/32  brain]
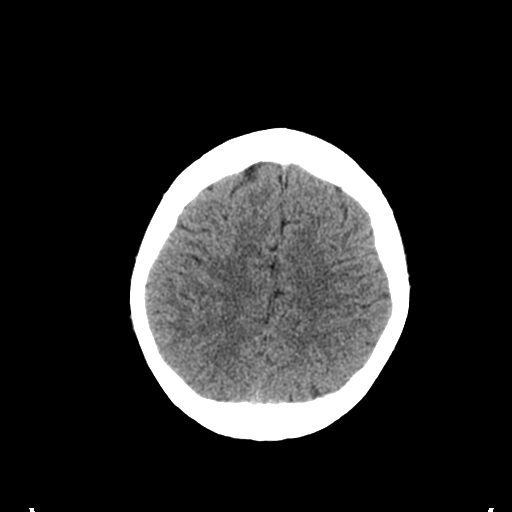
[im 26/32  brain]
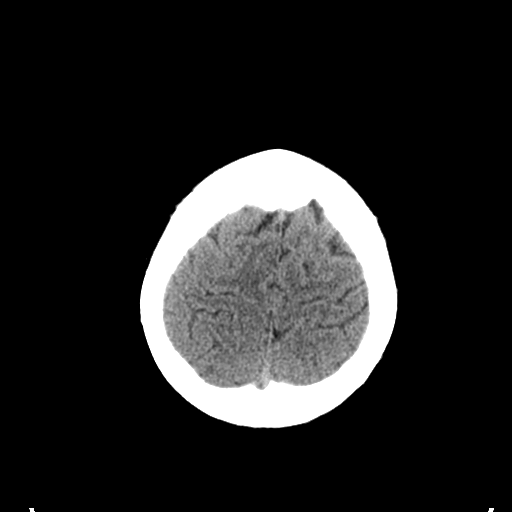
[im 26/32  bone]
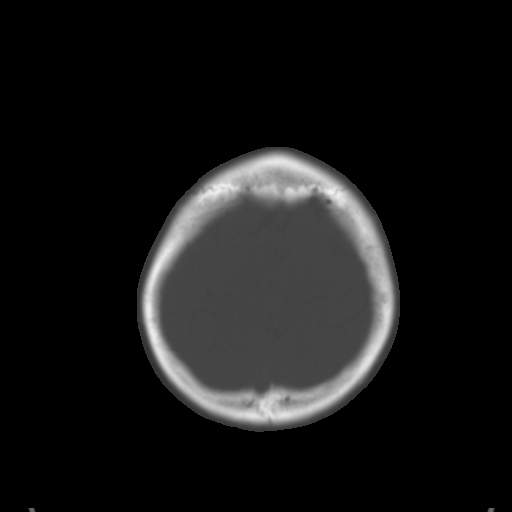
[im 29/32  brain]
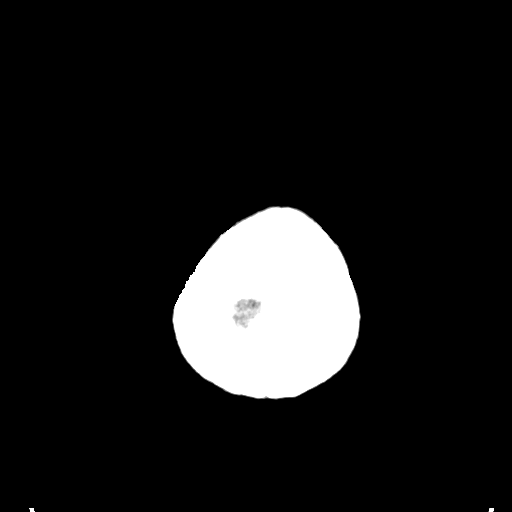

[Series 4: coronal soft tissue · coronal · 0.30mm/px · 3 of 60 slices shown]
[im 20/60  brain]
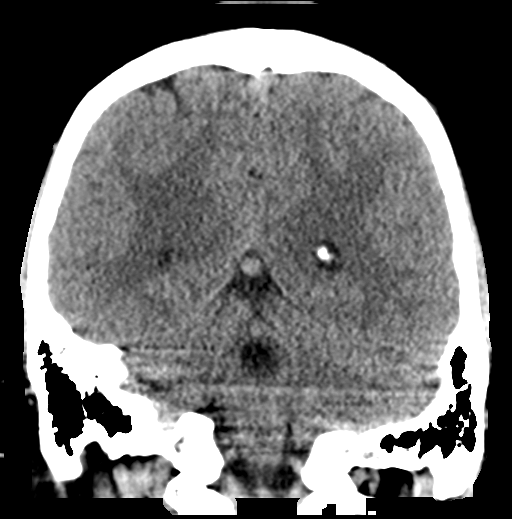
[im 27/60  brain]
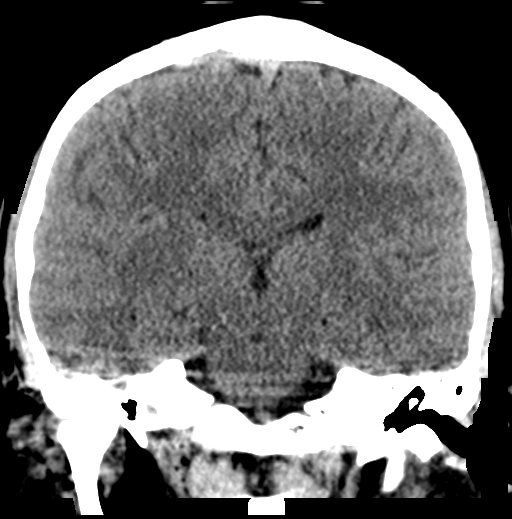
[im 33/60  brain]
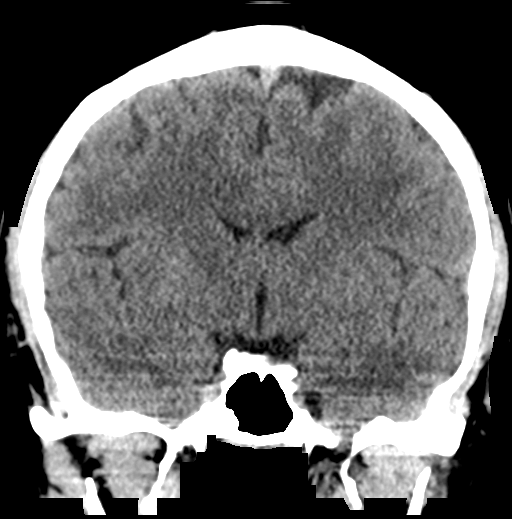

[Series 5: sagittal soft tissue · sagittal · 0.31mm/px · 3 of 52 slices shown]
[im 18/52  brain]
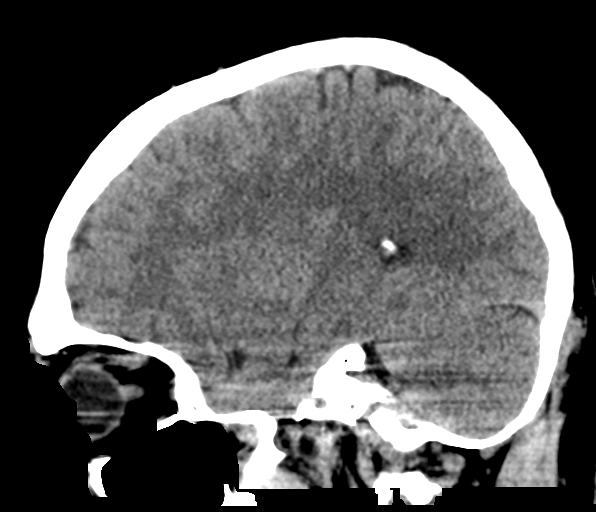
[im 26/52  brain]
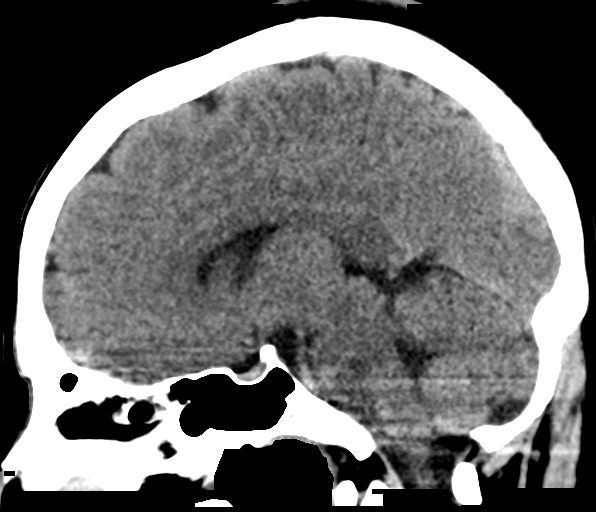
[im 35/52  brain]
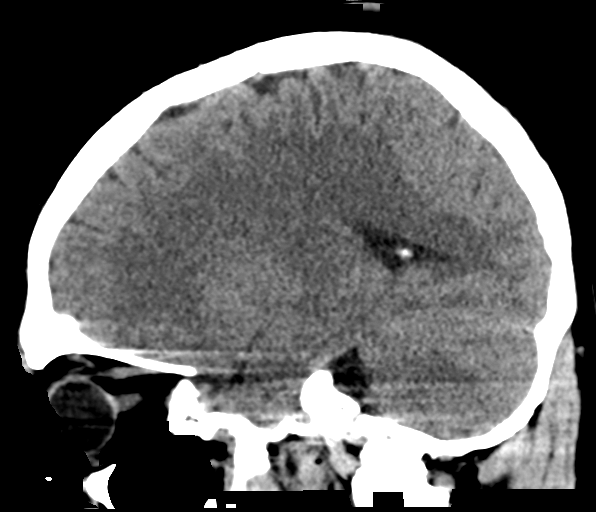

[16 of 47 positions shown; findings below may reference images not displayed]

FINDINGS: Brain: No evidence of parenchymal hemorrhage or extra-axial fluid
collection. No mass lesion, mass effect, or midline shift. No CT
evidence of acute infarction. Cerebral volume is age appropriate. No
ventriculomegaly.

Vascular: No acute abnormality.

Skull: No evidence of calvarial fracture.

Sinuses/Orbits: The visualized paranasal sinuses are essentially
clear.

Other:  The mastoid air cells are unopacified.
IMPRESSION: Negative head CT.  No evidence of acute intracranial abnormality.
